# Patient Record
Sex: Male | Born: 1990 | Race: Black or African American | Hispanic: No | Marital: Single | Smoking: Current every day smoker
Health system: Southern US, Community
[De-identification: ages and names within clinical notes are randomized; demographics above are authoritative.]

## PROBLEM LIST (undated history)

## (undated) HISTORY — PX: ANKLE SURGERY: SHX546

---

## 2016-11-28 ENCOUNTER — Encounter: Payer: Self-pay | Admitting: Emergency Medicine

## 2016-11-28 ENCOUNTER — Emergency Department
Admission: EM | Admit: 2016-11-28 | Discharge: 2016-11-28 | Disposition: A | Discharge status: Home / Self Care | Payer: Self-pay | Attending: Emergency Medicine | Admitting: Emergency Medicine

## 2016-11-28 DIAGNOSIS — L02612 Cutaneous abscess of left foot: Secondary | ICD-10-CM | POA: Insufficient documentation

## 2016-11-28 DIAGNOSIS — F17210 Nicotine dependence, cigarettes, uncomplicated: Secondary | ICD-10-CM | POA: Insufficient documentation

## 2016-11-28 MED ORDER — HYDROCODONE-ACETAMINOPHEN 5-325 MG PO TABS: 1.0000 | ORAL_TABLET | ORAL | 0 refills | Status: AC | PRN

## 2016-11-28 MED ORDER — SULFAMETHOXAZOLE-TRIMETHOPRIM 800-160 MG PO TABS: 1.0000 | ORAL_TABLET | Freq: Two times a day (BID) | ORAL | 0 refills | Status: AC

## 2016-11-28 NOTE — ED Provider Notes (Signed)
[facility] Emergency Department Provider Note  ____________________________________________  Time seen: Approximately 12:32 PM  I have reviewed the triage vital signs and the nursing notes.   HISTORY  Chief Complaint Abscess   HPI Nathan Hoffman is a 26 y.o. male who presents to the emergency department for evaluation and treatment of an abscess to his left heel that has been present for approximately 3 days.  He states that yesterday the area opened and began draining a white purulent drainage.  He denies fever.  He states that the area has been increasingly painful.  He does not recall any specific injury.  He does have a history of an Achilles repair and the abscess is near the site of the old incision.  He denies having had skin infections in the past. History reviewed. No pertinent past medical history.  There are no active problems to display for this patient.   Past Surgical History:  Procedure Laterality Date  . ANKLE SURGERY      Prior to Admission medications   Medication Sig Start Date End Date Taking? Authorizing Provider  HYDROcodone-acetaminophen (NORCO/VICODIN) 5-325 MG tablet Take 1 tablet by mouth every 4 (four) hours as needed for moderate pain. 11/28/16 11/28/17  Aalaya Yadao, Rulon Eisenmengerari B, FNP  sulfamethoxazole-trimethoprim (BACTRIM DS,SEPTRA DS) 800-160 MG tablet Take 1 tablet by mouth 2 (two) times daily. 11/28/16   Chinita Pesterriplett, Porchia Sinkler B, FNP    Allergies Patient has no known allergies.  No family history on file.  Social History Social History   Tobacco Use  . Smoking status: Current Every Day Smoker    Packs/day: 0.50    Types: Cigarettes  . Smokeless tobacco: Never Used  Substance Use Topics  . Alcohol use: Yes  . Drug use: No    Review of Systems  Constitutional: Negative for fever. Respiratory: Negative for cough or shortness of breath.  Musculoskeletal: Negative for myalgias Skin: Positive for abscess Neurological: Negative  for numbness or paresthesias. ____________________________________________   PHYSICAL EXAM:  VITAL SIGNS: ED Triage Vitals  Enc Vitals Group     BP 11/28/16 1143 139/70     Pulse Rate 11/28/16 1143 69     Resp 11/28/16 1329 18     Temp 11/28/16 1143 97.6 F (36.4 C)     Temp Source 11/28/16 1143 Oral     SpO2 11/28/16 1143 100 %     Weight 11/28/16 1143 225 lb (102.1 kg)     Height 11/28/16 1143 6\' 1"  (1.854 m)     Head Circumference --      Peak Flow --      Pain Score 11/28/16 1143 5     Pain Loc --      Pain Edu? --      Excl. in GC? --      Constitutional: Well appearing. Eyes: Conjunctivae are clear without discharge or drainage. Nose: No rhinorrhea noted. Mouth/Throat: Airway is patent.  Neck: No stridor. Unrestricted range of motion observed.  Cardiovascular: Capillary refill is <3 seconds.  Respiratory: Respirations are even and unlabored.. Musculoskeletal: Unrestricted range of motion observed. Neurologic: Awake, alert, and oriented x 4.  Skin: 1 cm mildly fluctuant area over the left Achilles that is draining purulent fluid.  No area of induration or erythema.   ____________________________________________   LABS (all labs ordered are listed, but only abnormal results are displayed)  Labs Reviewed - No data to display ____________________________________________  EKG  Not indicated ____________________________________________  RADIOLOGY  Not indicated ____________________________________________  PROCEDURES  Procedure(s) performed: None ____________________________________________   INITIAL IMPRESSION / ASSESSMENT AND PLAN / ED COURSE  Nathan Hoffman is a 26 y.o. male who presents to the emergency department for evaluation and treatment of an abscess to the left foot.  The area is spontaneously draining.  He will be placed on a course of Bactrim and given a prescription for Norco.  He was encouraged to follow-up with podiatry for symptoms that  are not improving over the weekend.  He was advised to return to the emergency department for symptoms change or worsen if he is unable to schedule an appointment.  Pertinent labs & imaging results that were available during my care of the patient were reviewed by me and considered in my medical decision making (see chart for details). ____________________________________________   FINAL CLINICAL IMPRESSION(S) / ED DIAGNOSES  Final diagnoses:  Abscess of left foot    Note:  This document was prepared using Dragon voice recognition software and may include unintentional dictation errors.    Chinita Pesterriplett, Juniper Snyders B, FNP 11/28/16 1409    Nita SickleVeronese, Burnett, MD 11/28/16 [phone]

## 2016-11-28 NOTE — ED Notes (Signed)
Patient complaining of drainage from surgical repair of right Achilles tendon (repair done in 2008).

## 2016-11-28 NOTE — ED Triage Notes (Signed)
Pt comes into the ED via POV c/o abscess on the right ankle.  Patient states it started two days ago and now it is currently draining purulent drainage.  Denies any fevers at home.  Patient states the abscess is on an old surgical scar.  Patient ambulatory to triage at this time and in NAD.

## 2016-11-28 NOTE — ED Notes (Signed)
Ace wrap and guaze applied to right ankle. Patient verbalized understanding of discharge instructions and follow-up care. Ambulatory to lobby with NAD noted.

## 2017-02-06 ENCOUNTER — Emergency Department: Payer: Self-pay

## 2017-02-06 ENCOUNTER — Other Ambulatory Visit: Payer: Self-pay

## 2017-02-06 ENCOUNTER — Emergency Department
Admission: EM | Admit: 2017-02-06 | Discharge: 2017-02-06 | Disposition: A | Discharge status: Home / Self Care | Payer: Self-pay | Attending: Emergency Medicine | Admitting: Emergency Medicine

## 2017-02-06 ENCOUNTER — Encounter: Payer: Self-pay | Admitting: Emergency Medicine

## 2017-02-06 DIAGNOSIS — Z79899 Other long term (current) drug therapy: Secondary | ICD-10-CM | POA: Insufficient documentation

## 2017-02-06 DIAGNOSIS — M86671 Other chronic osteomyelitis, right ankle and foot: Secondary | ICD-10-CM | POA: Insufficient documentation

## 2017-02-06 DIAGNOSIS — F17210 Nicotine dependence, cigarettes, uncomplicated: Secondary | ICD-10-CM | POA: Insufficient documentation

## 2017-02-06 LAB — CBC WITH DIFFERENTIAL/PLATELET
Basophils Absolute: 0 10*3/uL (ref 0–0.1)
Basophils Relative: 1 %
Eosinophils Absolute: 0.1 10*3/uL (ref 0–0.7)
Eosinophils Relative: 2 %
HCT: 46.6 % (ref 40.0–52.0)
Hemoglobin: 15.9 g/dL (ref 13.0–18.0)
Lymphocytes Relative: 25 %
Lymphs Abs: 1.8 10*3/uL (ref 1.0–3.6)
MCH: 30.4 pg (ref 26.0–34.0)
MCHC: 34.1 g/dL (ref 32.0–36.0)
MCV: 89.2 fL (ref 80.0–100.0)
Monocytes Absolute: 0.6 10*3/uL (ref 0.2–1.0)
Monocytes Relative: 9 %
Neutro Abs: 4.6 10*3/uL (ref 1.4–6.5)
Neutrophils Relative %: 63 %
Platelets: 295 10*3/uL (ref 150–440)
RBC: 5.22 MIL/uL (ref 4.40–5.90)
RDW: 13.4 % (ref 11.5–14.5)
WBC: 7.3 10*3/uL (ref 3.8–10.6)

## 2017-02-06 LAB — COMPREHENSIVE METABOLIC PANEL
ALT: 28 U/L (ref 17–63)
AST: 22 U/L (ref 15–41)
Albumin: 4.6 g/dL (ref 3.5–5.0)
Alkaline Phosphatase: 59 U/L (ref 38–126)
Anion gap: 14 (ref 5–15)
BUN: 12 mg/dL (ref 6–20)
CO2: 21 mmol/L — ABNORMAL LOW (ref 22–32)
Calcium: 9.7 mg/dL (ref 8.9–10.3)
Chloride: 103 mmol/L (ref 101–111)
Creatinine, Ser: 1.16 mg/dL (ref 0.61–1.24)
GFR calc Af Amer: >60 mL/min (ref >60)
GFR calc non Af Amer: >60 mL/min (ref >60)
Glucose, Bld: 93 mg/dL (ref 65–99)
Potassium: 4 mmol/L (ref 3.5–5.1)
Sodium: 138 mmol/L (ref 135–145)
Total Bilirubin: 0.6 mg/dL (ref 0.3–1.2)
Total Protein: 8.4 g/dL — ABNORMAL HIGH (ref 6.5–8.1)

## 2017-02-06 MED ORDER — CLINDAMYCIN PHOSPHATE 600 MG/50ML IV SOLN
600.0000 mg | Freq: Once | INTRAVENOUS | Status: AC
  Administered 2017-02-06: 600 mg via INTRAVENOUS
  Filled 2017-02-06: qty 50

## 2017-02-06 MED ORDER — IOPAMIDOL (ISOVUE-370) INJECTION 76%
75.0000 mL | Freq: Once | INTRAVENOUS | Status: AC | PRN
  Administered 2017-02-06: 75 mL via INTRAVENOUS
  Filled 2017-02-06: qty 75

## 2017-02-06 MED ORDER — IOPAMIDOL (ISOVUE-300) INJECTION 61%
75.0000 mL | Freq: Once | INTRAVENOUS | Status: DC | PRN
  Filled 2017-02-06: qty 75

## 2017-02-06 NOTE — ED Notes (Signed)
Pt presents with abscess on right ankle. Pt states he was seen here 2 mths ago and given bacterium but has not helped. Pt is NAD awaiting EDP.

## 2017-02-06 NOTE — ED Triage Notes (Signed)
Pt to ED via POV c/o abscess on right ankle. Pt states that he was seen for same 2 months ago, was given antibiotics but it has not cleared up.

## 2017-02-06 NOTE — ED Provider Notes (Signed)
[facility] Emergency Department Provider Note  ____________________________________________  Time seen: Approximately 3:30 PM  I have reviewed the triage vital signs and the nursing notes.   HISTORY  Chief Complaint Abscess    HPI Nathan Hoffman is a 27 y.o. male with a history of right Achilles tendon repair 8 years ago in [place] presents to the emergency department with a chronic abscess of the right posterior ankle in the distribution of the Achilles tendon.  Patient reports that he was seen approximately 8 weeks ago and was discharged with Bactrim.  Patient reports that initially, abscess seemed to decrease in size but after completing Bactrim, abscess enlarged.  Patient reports that abscess spontaneously drains with ambulation.  He denies fever and chills.  Patient has tried self-expression numerous times.  He has noticed surrounding erythema and callused dermatitis.  Patient experiences pain with ambulation and relief of pain with rest.   History reviewed. No pertinent past medical history.  There are no active problems to display for this patient.   Past Surgical History:  Procedure Laterality Date  . ANKLE SURGERY      Prior to Admission medications   Medication Sig Start Date End Date Taking? Authorizing Provider  HYDROcodone-acetaminophen (NORCO/VICODIN) 5-325 MG tablet Take 1 tablet by mouth every 4 (four) hours as needed for moderate pain. 11/28/16 11/28/17  Triplett, Rulon Eisenmenger B, FNP  sulfamethoxazole-trimethoprim (BACTRIM DS,SEPTRA DS) 800-160 MG tablet Take 1 tablet by mouth 2 (two) times daily. 11/28/16   Chinita Pester, FNP    Allergies Patient has no known allergies.  No family history on file.  Social History Social History   Tobacco Use  . Smoking status: Current Every Day Smoker    Packs/day: 0.50    Types: Cigarettes  . Smokeless tobacco: Never Used  Substance Use Topics  . Alcohol use: Yes  . Drug use: No     Review of  Systems  Constitutional: No fever/chills Eyes: No visual changes. No discharge ENT: No upper respiratory complaints. Cardiovascular: no chest pain. Respiratory: no cough. No SOB. Musculoskeletal: Patient has right ankle pain.  Skin: Patient has cellulitis of right ankle.  Neurological: Negative for headaches, focal weakness or numbness.  ____________________________________________   PHYSICAL EXAM:  VITAL SIGNS: ED Triage Vitals [02/06/17 1420]  Enc Vitals Group     BP 139/69     Pulse Rate 64     Resp 18     Temp 98.4 F (36.9 C)     Temp Source Oral     SpO2 100 %     Weight      Height      Head Circumference      Peak Flow      Pain Score      Pain Loc      Pain Edu?      Excl. in GC?      Constitutional: Alert and oriented. Well appearing and in no acute distress. Eyes: Conjunctivae are normal. PERRL. EOMI. Cardiovascular: Normal rate, regular rhythm. Normal S1 and S2.  Good peripheral circulation. Respiratory: Normal respiratory effort without tachypnea or retractions. Lungs CTAB. Good air entry to the bases with no decreased or absent breath sounds. Musculoskeletal: Patient is able to move all 5 right toes.  No pain is elicited with palpation of the right calf.  Palpable dorsalis pedis pulse, right. Neurologic:  Normal speech and language. No gross focal neurologic deficits are appreciated.  Skin: Patient has induration and erythema along the course of  distal Achilles tendon with purulent exudate expressed with distal to proximal palpation of Achilles tendon. Psychiatric: Mood and affect are normal. Speech and behavior are normal. Patient exhibits appropriate insight and judgement.   ____________________________________________   LABS (all labs ordered are listed, but only abnormal results are displayed)  Labs Reviewed  COMPREHENSIVE METABOLIC PANEL - Abnormal; Notable for the following components:      Result Value   CO2 21 (*)    Total Protein 8.4 (*)     All other components within normal limits  CBC WITH DIFFERENTIAL/PLATELET   ____________________________________________  EKG   ____________________________________________  RADIOLOGY Geraldo PitterI, Zailey Audia M Maddi Collar, personally viewed and evaluated these images (plain radiographs) as part of my medical decision making, as well as reviewing the written report by the radiologist.   Dg Ankle Complete Right  Result Date: 02/06/2017 CLINICAL DATA:  Chronic right foot abscess EXAM: RIGHT ANKLE - COMPLETE 3+ VIEW COMPARISON:  None. FINDINGS: No fracture or dislocation is seen. The ankle mortise is intact. Postsurgical changes involving the posterior calcaneus. The visualized soft tissues are unremarkable. No radiopaque foreign body is seen. IMPRESSION: Negative. Electronically Signed   By: Charline BillsSriyesh  Krishnan M.D.   On: 02/06/2017 15:32   Ct Extremity Lower Right W Contrast  Result Date: 02/06/2017 CLINICAL DATA:  Cellulitis of the right lower leg. EXAM: CT OF THE LOWER RIGHT EXTREMITY WITH CONTRAST TECHNIQUE: Multidetector CT imaging of the lower right extremity was performed according to the standard protocol following intravenous contrast administration. COMPARISON:  Radiographs dated 02/06/2017 CONTRAST:  75mL ISOVUE-370 IOPAMIDOL (ISOVUE-370) INJECTION 76% FINDINGS: Bones/Joint/Cartilage There is chronic bone resorption around the metallic anchor in the posterior calcaneus at the site of previous Achilles tendon repair. This is consistent with chronic osteomyelitis. No other significant bone abnormality. There is a chronic area of irregular sclerosis in the anterolateral aspect of the proximal tibia consistent with a benign bone island. Muscles and Tendons There is marked hypertrophy of the distal Achilles tendon with small areas of lucency within the tendon best seen on series 7, probably representing small pus collections. There is slight soft tissue stranding around the hypertrophied tendon extending into  Kager's fat pad anterior to the distal Achilles tendon. Muscles of the lower leg appear normal. No joint effusions. No soft tissue abscesses extrinsic to the Achilles tendon. IMPRESSION: 1. Chronic osteomyelitis of the posterior calcaneus around the surgical anchor at the site of Achilles tendon repair. Bone is been resorbed around the anchor. 2. 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 Adjacent cellulitis. Electronically Signed   By: Francene BoyersJames  Maxwell M.D.   On: 02/06/2017 17:19    ____________________________________________    PROCEDURES  Procedure(s) performed:    Procedures    Medications  clindamycin (CLEOCIN) IVPB 600 mg (600 mg Intravenous New Bag/Given 02/06/17 1822)  iopamidol (ISOVUE-370) 76 % injection 75 mL (75 mLs Intravenous Contrast Given 02/06/17 1645)     ____________________________________________   INITIAL IMPRESSION / ASSESSMENT AND PLAN / ED COURSE  Pertinent labs & imaging results that were available during my care of the patient were reviewed by me and considered in my medical decision making (see chart for details).  Review of the [place] CSRS was performed in accordance of the [facility] prior to dispensing any controlled drugs.     Assessment and Plan:  Osteomyelitis Abscess Patient presents to the emergency department with right ankle pain Differential diagnosis included cellulitis, abscess and osteomyelitis.  Due to chronic  nature of cellulitis and possible abscess, CT of right lower extremity was obtained which was concerning for chronic osteomyelitis and an abscess within the hypertrophied distal Achilles tendon.  Dr. Odis Luster was consulted regarding patient's case.  Dr. Odis Luster advised that patient be made n.p.o. after midnight and that patient should call office tomorrow.   Dr. Odis Luster conveyed that patient will be taken to the OR tomorrow for debridement.  Patient was given IV clindamycin in  the emergency department. Patient instructions were given to patient during 3 different instances of this emergency department encounter and patient voiced understanding.  All patient questions were answered.    ____________________________________________  FINAL CLINICAL IMPRESSION(S) / ED DIAGNOSES  Final diagnoses:  Other chronic osteomyelitis of right ankle (HCC)      NEW MEDICATIONS STARTED DURING THIS VISIT:  ED Discharge Orders    None          This chart was dictated using voice recognition software/Dragon. Despite best efforts to proofread, errors can occur which can change the meaning. Any change was purely unintentional.    Orvil Feil, PA-C 02/06/17 1848    Myrna Blazer, MD 02/08/17 [phone]

## 2019-09-23 IMAGING — CT CT EXTREM LOW W/ CM*R*
2 of 3 series · 11 of 46 positions shown, 12 images · IV contrast (agent unspecified)
Comparison: Radiographs dated 02/06/2017

CONTRAST:  75mL ARS21H-UCB IOPAMIDOL (ARS21H-UCB) INJECTION 76%

CLINICAL DATA: Cellulitis of the right lower leg.

EXAM:
CT OF THE LOWER RIGHT EXTREMITY WITH CONTRAST
TECHNIQUE: Multidetector CT imaging of the lower right extremity was performed
according to the standard protocol following intravenous contrast
administration.

[Series 7: axial st · axial · 0.53mm/px · z∈[-250,+318]mm · 8 of 434 slices shown, 9 images]
[im 28/434  soft-tissue]
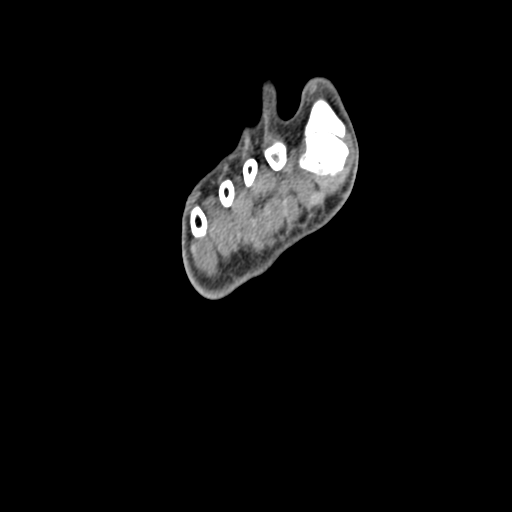
[im 28/434  bone]
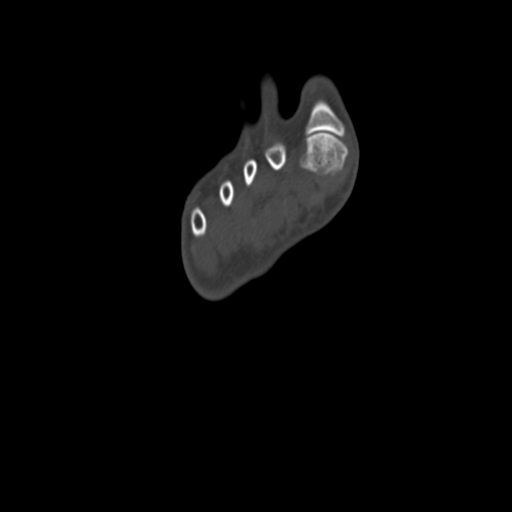
[im 84/434  soft-tissue]
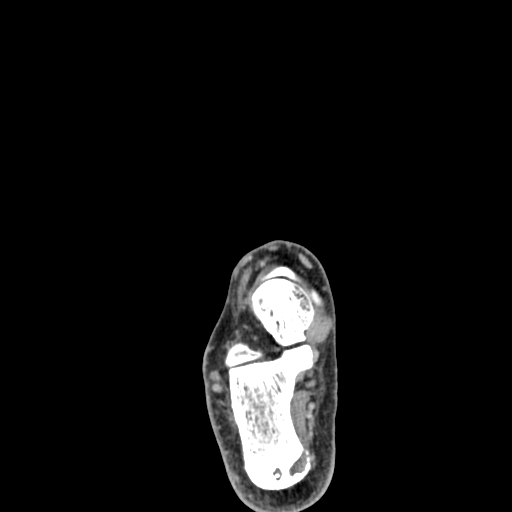
[im 140/434  soft-tissue]
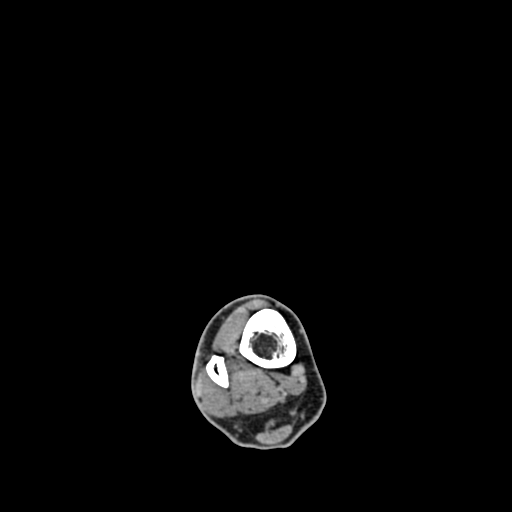
[im 196/434  soft-tissue]
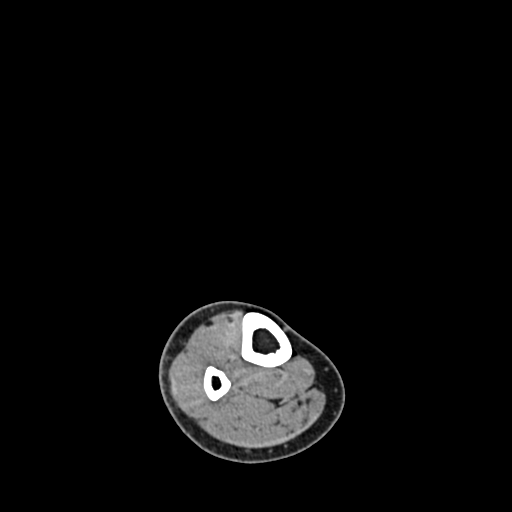
[im 238/434  soft-tissue]
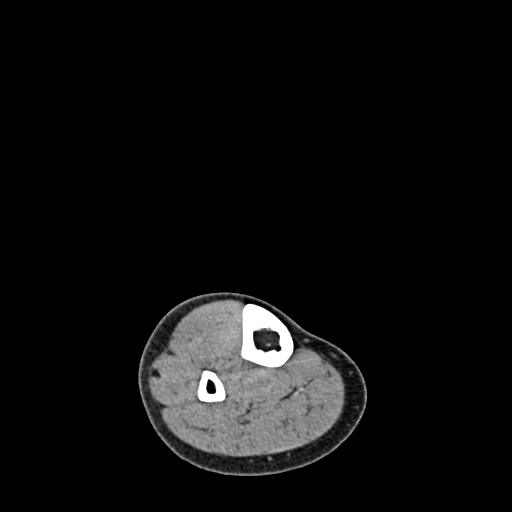
[im 294/434  soft-tissue]
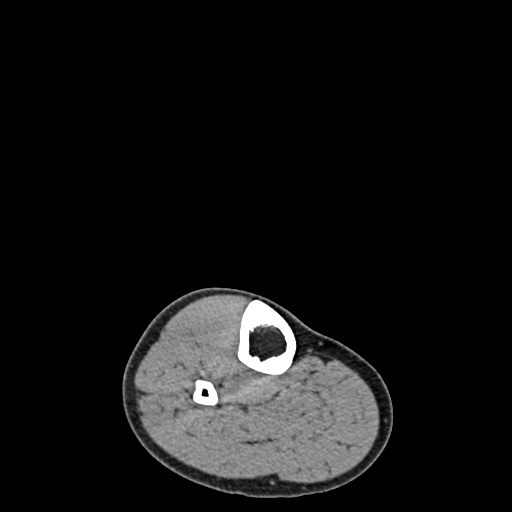
[im 350/434  soft-tissue]
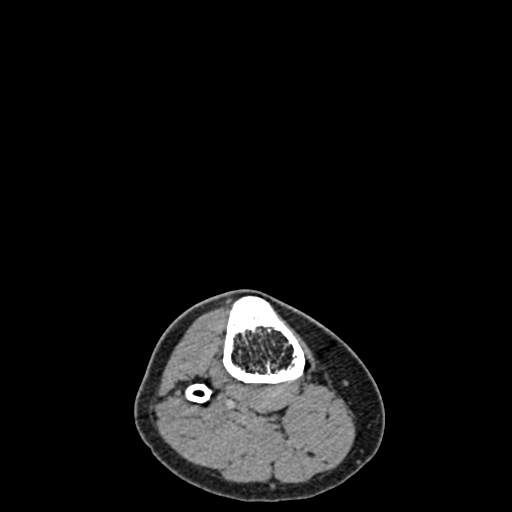
[im 406/434  soft-tissue]
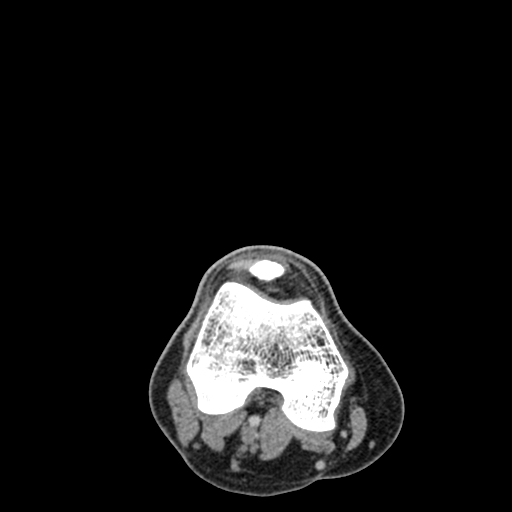

[Series 8: coronal st · coronal · 0.40mm/px · 3 of 180 slices shown]
[im 60/180  soft-tissue]
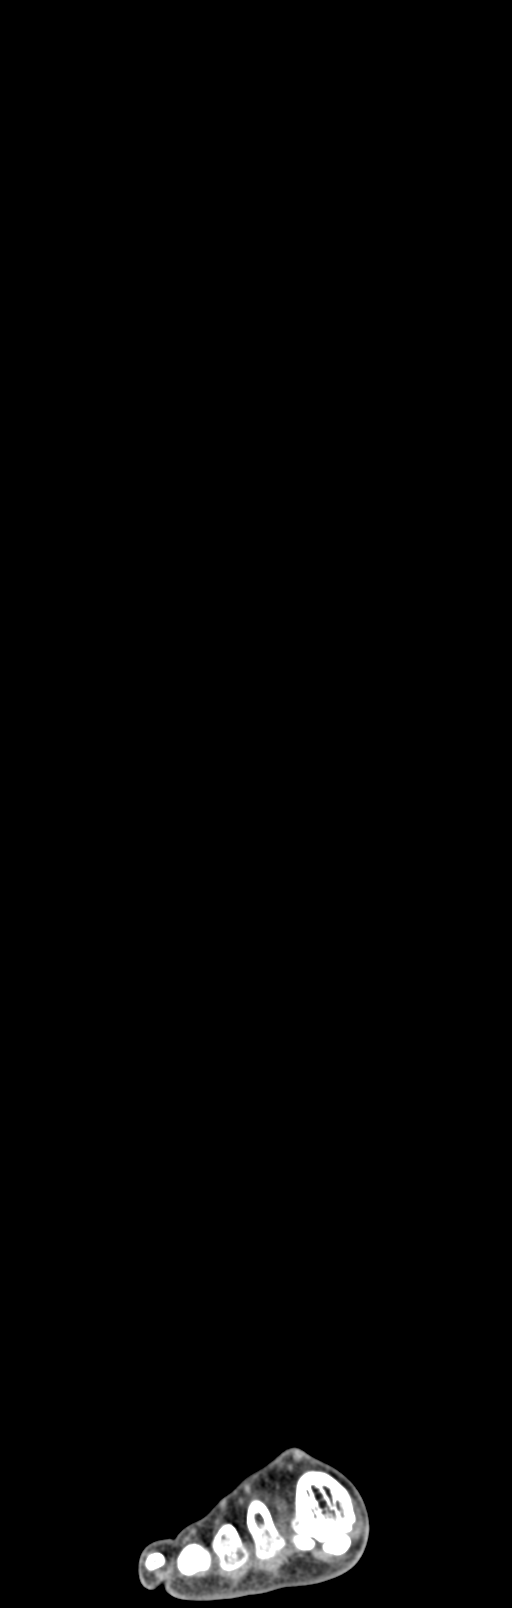
[im 80/180  soft-tissue]
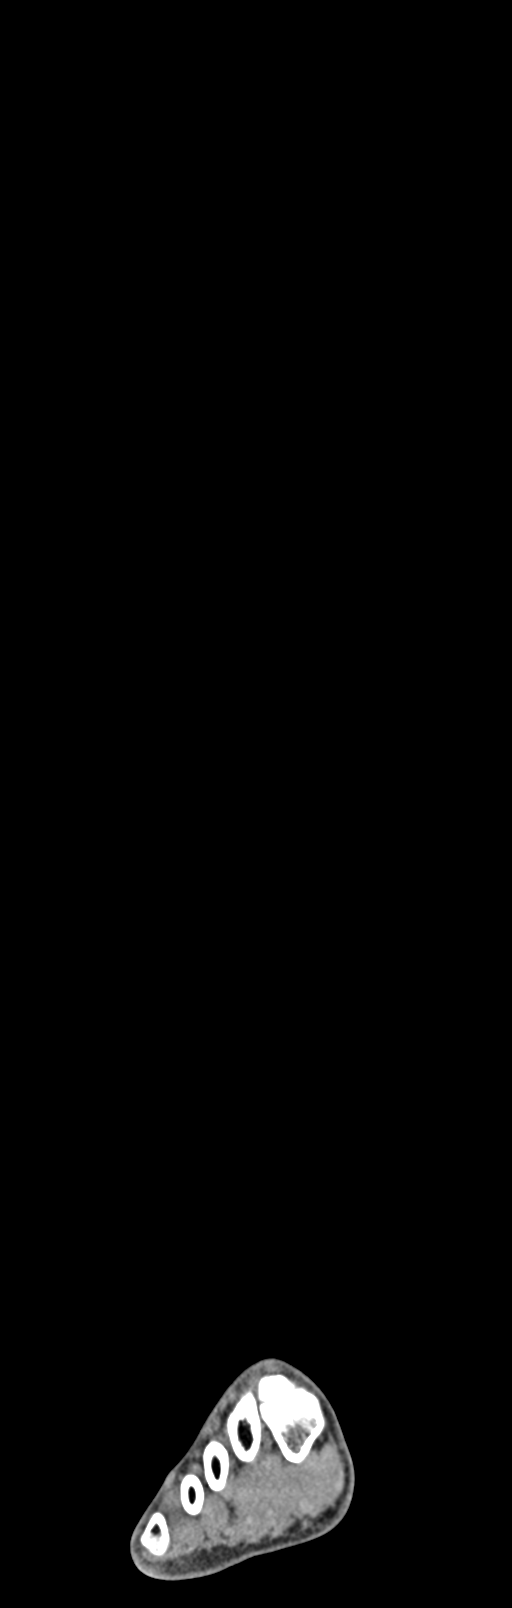
[im 100/180  soft-tissue]
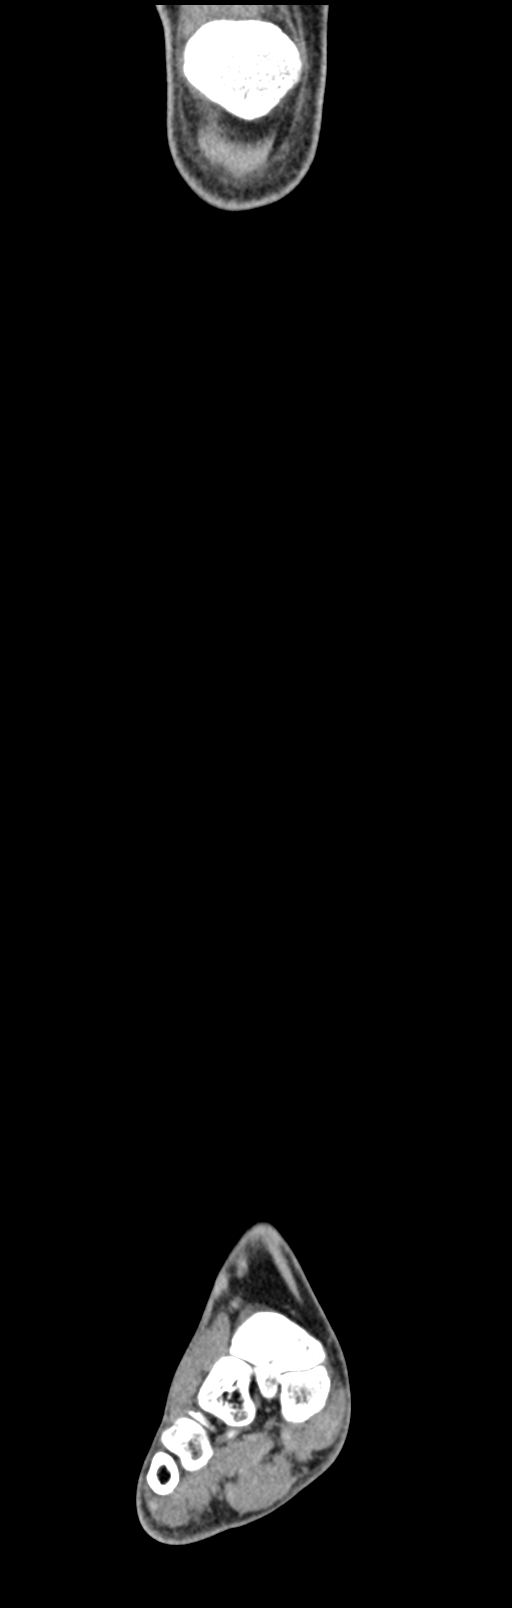

[11 of 46 positions shown; findings below may reference images not displayed]

FINDINGS: Bones/Joint/Cartilage

There is chronic bone resorption around the metallic anchor in the
posterior calcaneus at the site of previous Achilles tendon repair.
This is consistent with chronic osteomyelitis.

No other significant bone abnormality. There is a chronic area of
irregular sclerosis in the anterolateral aspect of the proximal
tibia consistent with a benign bone island.

Muscles and Tendons

There is marked hypertrophy of the distal Achilles tendon with small
areas of lucency within the tendon best seen on series 7, probably
representing small pus collections. There is slight soft tissue
stranding around the hypertrophied tendon extending into Kager's fat
pad anterior to the distal Achilles tendon.

Muscles of the lower leg appear normal. No joint effusions. No soft
tissue abscesses extrinsic to the Achilles tendon.
IMPRESSION: 1. Chronic osteomyelitis of the posterior calcaneus around the
surgical anchor at the site of Achilles tendon repair. Bone is been
resorbed around the anchor.
2. Chronic hypertrophy of the distal Achilles tendon with probable
tiny abscesses in the hypertrophied distal tendon. Adjacent
cellulitis.

## 2020-06-23 ENCOUNTER — Other Ambulatory Visit: Payer: Self-pay

## 2020-06-23 ENCOUNTER — Emergency Department
Admission: EM | Admit: 2020-06-23 | Discharge: 2020-06-23 | Disposition: A | Discharge status: Home / Self Care | Payer: Self-pay | Attending: Emergency Medicine | Admitting: Emergency Medicine

## 2020-06-23 DIAGNOSIS — E860 Dehydration: Secondary | ICD-10-CM | POA: Insufficient documentation

## 2020-06-23 DIAGNOSIS — T675XXA Heat exhaustion, unspecified, initial encounter: Secondary | ICD-10-CM | POA: Insufficient documentation

## 2020-06-23 DIAGNOSIS — F17210 Nicotine dependence, cigarettes, uncomplicated: Secondary | ICD-10-CM | POA: Insufficient documentation

## 2020-06-23 LAB — BASIC METABOLIC PANEL
Anion gap: 10 (ref 5–15)
BUN: 12 mg/dL (ref 6–20)
CO2: 24 mmol/L (ref 22–32)
Calcium: 9.9 mg/dL (ref 8.9–10.3)
Chloride: 106 mmol/L (ref 98–111)
Creatinine, Ser: 1.18 mg/dL (ref 0.61–1.24)
GFR, Estimated: >60 mL/min (ref >60)
Glucose, Bld: 113 mg/dL — ABNORMAL HIGH (ref 70–99)
Potassium: 3.7 mmol/L (ref 3.5–5.1)
Sodium: 140 mmol/L (ref 135–145)

## 2020-06-23 LAB — TROPONIN I (HIGH SENSITIVITY): Troponin I (High Sensitivity): 3 ng/L (ref <18)

## 2020-06-23 LAB — CBC
HCT: 44.7 % (ref 39.0–52.0)
Hemoglobin: 15.5 g/dL (ref 13.0–17.0)
MCH: 30.5 pg (ref 26.0–34.0)
MCHC: 34.7 g/dL (ref 30.0–36.0)
MCV: 87.8 fL (ref 80.0–100.0)
Platelets: 335 10*3/uL (ref 150–400)
RBC: 5.09 MIL/uL (ref 4.22–5.81)
RDW: 12.4 % (ref 11.5–15.5)
WBC: 7.8 10*3/uL (ref 4.0–10.5)
nRBC: 0 % (ref 0.0–0.2)

## 2020-06-23 MED ORDER — ONDANSETRON HCL 4 MG/2ML IJ SOLN
4.0000 mg | Freq: Once | INTRAMUSCULAR | Status: AC
  Administered 2020-06-23: 10:00:00 4 mg via INTRAVENOUS
  Filled 2020-06-23: qty 2

## 2020-06-23 MED ORDER — LACTATED RINGERS IV BOLUS
1000.0000 mL | Freq: Once | INTRAVENOUS | Status: AC
  Administered 2020-06-23: 10:00:00 1000 mL via INTRAVENOUS

## 2020-06-23 NOTE — ED Triage Notes (Signed)
Pt to ED POV for dizziness and nausea. States he felt like he was going to pass out last night.  Denies cp.  +shob that started yesterday. Pt in NAD, RR even and unlabored, speaking in complete sentences Ambulatory  Pt states he was working in heat yesterday

## 2020-06-23 NOTE — ED Notes (Signed)
Lab called and informed of troponin add-on. Lab verbalized understanding.  

## 2020-06-23 NOTE — ED Provider Notes (Signed)
[facility] Emergency Department Provider Note  ____________________________________________   Event Date/Time   First MD Initiated Contact with Patient 06/23/20 [id number]     (approximate)  I have reviewed the triage vital signs and the nursing notes.   HISTORY  Chief Complaint Dizziness    HPI Nathan Hoffman is a 30 y.o. male here with generalized weakness and lightheadedness.  The patient states that for the last 4 days, he started a new job working with [employer].  He was working in the heat, Clinical research associate.  He states he was drinking water throughout the day but felt like he was still getting dehydrated and did not urinate much.  He states that over the last 24 hours, he has felt very weak, lightheaded, and especially dizzy upon standing.  He got up to take his partner to work today, and while sitting in the car, began to feel lightheaded and dizzy.  He then became nauseous and vomited.  He states he feels fatigued.  Denies any overt chest pain.  No abdominal pain.  No recent changes in health.  No medication changes.  No history of cardiac or pulmonary disease.  He states that this job is new and is significantly more physically active than he was prior to obtaining the job.  He is working in high heat without air conditioning.    History reviewed. No pertinent past medical history.  There are no problems to display for this patient.   Past Surgical History:  Procedure Laterality Date   ANKLE SURGERY      Prior to Admission medications   Medication Sig Start Date End Date Taking? Authorizing Provider  sulfamethoxazole-trimethoprim (BACTRIM DS,SEPTRA DS) 800-160 MG tablet Take 1 tablet by mouth 2 (two) times daily. 11/28/16   Chinita Pester, FNP    Allergies Patient has no known allergies.  No family history on file.  Social History Social History   Tobacco Use   Smoking status: Every Day    Packs/day: 0.50    Pack years: 0.00    Types:  Cigarettes   Smokeless tobacco: Never  Substance Use Topics   Alcohol use: Yes   Drug use: No    Review of Systems  Review of Systems  Constitutional:  Positive for fatigue. Negative for chills and fever.  HENT:  Negative for sore throat.   Respiratory:  Negative for shortness of breath.   Cardiovascular:  Negative for chest pain.  Gastrointestinal:  Negative for abdominal pain.  Genitourinary:  Negative for flank pain.  Musculoskeletal:  Negative for neck pain.  Skin:  Negative for rash and wound.  Allergic/Immunologic: Negative for immunocompromised state.  Neurological:  Positive for syncope, weakness and light-headedness. Negative for numbness.  Hematological:  Does not bruise/bleed easily.  All other systems reviewed and are negative.   ____________________________________________  PHYSICAL EXAM:      VITAL SIGNS: ED Triage Vitals  Enc Vitals Group     BP 06/23/20 0723 116/87     Pulse Rate 06/23/20 0723 75     Resp 06/23/20 0723 18     Temp 06/23/20 0723 97.8 F (36.6 C)     Temp Source 06/23/20 0723 Oral     SpO2 06/23/20 0723 100 %     Weight 06/23/20 0724 243 lb (110.2 kg)     Height 06/23/20 0724 6\' 4"  (1.93 m)     Head Circumference --      Peak Flow --      Pain Score  06/23/20 0724 0     Pain Loc --      Pain Edu? --      Excl. in GC? --      Physical Exam Vitals and nursing note reviewed.  Constitutional:      General: He is not in acute distress.    Appearance: He is well-developed.  HENT:     Head: Normocephalic and atraumatic.     Mouth/Throat:     Mouth: Mucous membranes are dry.  Eyes:     Conjunctiva/sclera: Conjunctivae normal.  Cardiovascular:     Rate and Rhythm: Normal rate and regular rhythm.     Heart sounds: Normal heart sounds. No murmur heard.   No friction rub.  Pulmonary:     Effort: Pulmonary effort is normal. No respiratory distress.     Breath sounds: Normal breath sounds. No wheezing or rales.  Abdominal:     General:  There is no distension.     Palpations: Abdomen is soft.     Tenderness: There is no abdominal tenderness.  Musculoskeletal:     Cervical back: Neck supple.  Skin:    General: Skin is warm.     Capillary Refill: Capillary refill takes less than 2 seconds.  Neurological:     Mental Status: He is alert and oriented to person, place, and time.     Motor: No abnormal muscle tone.      ____________________________________________   LABS (all labs ordered are listed, but only abnormal results are displayed)  Labs Reviewed  BASIC METABOLIC PANEL - Abnormal; Notable for the following components:      Result Value   Glucose, Bld 113 (*)    All other components within normal limits  CBC  URINALYSIS, COMPLETE (UACMP) WITH MICROSCOPIC  TROPONIN I (HIGH SENSITIVITY)    ____________________________________________  EKG: Normal sinus rhythm, ventricular rate 73.  PR 170, QRS 90, QTc 4 1.  No acute ST elevations or depressions.  No acute events of acute ischemia or infarct. ________________________________________  RADIOLOGY All imaging, including plain films, CT scans, and ultrasounds, independently reviewed by me, and interpretations confirmed via formal radiology reads.  ED MD interpretation:   None  Official radiology report(s): No results found.  ____________________________________________  PROCEDURES   Procedure(s) performed (including Critical Care):  Procedures  ____________________________________________  INITIAL IMPRESSION / MDM / ASSESSMENT AND PLAN / ED COURSE  As part of my medical decision making, I reviewed the following data within the electronic MEDICAL RECORD NUMBER Nursing notes reviewed and incorporated, Old chart reviewed, Notes from prior ED visits, and [place] Controlled Substance Database       *Nathan Hoffman was evaluated in Emergency Department on 06/23/2020 for the symptoms described in the history of present illness. He was evaluated in the context of  the global COVID-19 pandemic, which necessitated consideration that the patient might be at risk for infection with the SARS-CoV-2 virus that causes COVID-19. Institutional protocols and algorithms that pertain to the evaluation of patients at risk for COVID-19 are in a state of rapid change based on information released by regulatory bodies including the CDC and federal and state organizations. These policies and algorithms were followed during the patient's care in the ED.  Some ED evaluations and interventions may be delayed as a result of limited staffing during the pandemic.*     Medical Decision Making: Very pleasant 30 year old male here with generalized weakness and lightheadedness.  I suspect this is related to heat exhaustion and exposure.  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  Symptoms are worse with standing.  Patient given fluids and feels markedly improved in the ED here.  His lab work is overall very reassuring.  BMP unremarkable with normal renal function.  CBC without anemia or other abnormality.  Troponin negative and EKG is nonischemic.  No arrhythmia noted.  Will encourage the patient to hydrate at home, and encouraged him to discuss with his employment, as he should be allowed adequate time to rest and cool off when working in extreme heat.  Work note was provided.  ____________________________________________  FINAL CLINICAL IMPRESSION(S) / ED DIAGNOSES  Final diagnoses:  Heat exhaustion, initial encounter  Dehydration     MEDICATIONS GIVEN DURING THIS VISIT:  Medications  lactated ringers bolus 1,000 mL (0 mLs Intravenous Stopped 06/23/20 1049)  ondansetron (ZOFRAN) injection 4 mg (4 mg Intravenous Given 06/23/20 0944)     ED Discharge Orders     None        Note:  This document was prepared using Dragon voice recognition software and may include unintentional dictation errors.   Shaune Pollack,  MD 06/23/20 1214

## 2020-06-23 NOTE — ED Notes (Signed)
ED Provider at bedside. 

## 2021-12-08 DIAGNOSIS — Z419 Encounter for procedure for purposes other than remedying health state, unspecified: Secondary | ICD-10-CM | POA: Diagnosis not present

## 2022-01-08 DIAGNOSIS — Z419 Encounter for procedure for purposes other than remedying health state, unspecified: Secondary | ICD-10-CM | POA: Diagnosis not present

## 2022-01-23 ENCOUNTER — Other Ambulatory Visit (LOCAL_COMMUNITY_HEALTH_CENTER): Payer: Self-pay

## 2022-01-23 DIAGNOSIS — Z111 Encounter for screening for respiratory tuberculosis: Secondary | ICD-10-CM

## 2022-01-23 NOTE — Patient Instructions (Signed)
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  ROI signed and pt will also check My Chart for results.  Walked pt to lab for blood draw.    Tonny Branch, RN

## 2022-01-26 ENCOUNTER — Telehealth: Payer: Self-pay

## 2022-01-26 LAB — QUANTIFERON-TB GOLD PLUS
QuantiFERON Mitogen Value: >10 IU/mL
QuantiFERON Nil Value: 0 IU/mL
QuantiFERON TB1 Ag Value: 0 IU/mL
QuantiFERON TB2 Ag Value: 0 IU/mL
QuantiFERON-TB Gold Plus: NEGATIVE

## 2022-01-26 NOTE — Telephone Encounter (Signed)
Call to patient with -QFT results. Servando Salina, RN

## 2022-02-08 DIAGNOSIS — Z419 Encounter for procedure for purposes other than remedying health state, unspecified: Secondary | ICD-10-CM | POA: Diagnosis not present

## 2022-02-14 ENCOUNTER — Telehealth: Payer: Self-pay

## 2022-02-14 NOTE — Telephone Encounter (Signed)
Mychart msg sent. AS, CMA 

## 2022-03-09 DIAGNOSIS — Z419 Encounter for procedure for purposes other than remedying health state, unspecified: Secondary | ICD-10-CM | POA: Diagnosis not present

## 2022-03-12 DIAGNOSIS — E669 Obesity, unspecified: Secondary | ICD-10-CM | POA: Diagnosis not present

## 2022-03-12 DIAGNOSIS — K219 Gastro-esophageal reflux disease without esophagitis: Secondary | ICD-10-CM | POA: Diagnosis not present

## 2022-03-12 DIAGNOSIS — R5383 Other fatigue: Secondary | ICD-10-CM | POA: Diagnosis not present

## 2022-03-12 DIAGNOSIS — E785 Hyperlipidemia, unspecified: Secondary | ICD-10-CM | POA: Diagnosis not present

## 2022-03-12 DIAGNOSIS — R609 Edema, unspecified: Secondary | ICD-10-CM | POA: Diagnosis not present

## 2022-04-09 DIAGNOSIS — Z419 Encounter for procedure for purposes other than remedying health state, unspecified: Secondary | ICD-10-CM | POA: Diagnosis not present

## 2022-05-09 DIAGNOSIS — Z419 Encounter for procedure for purposes other than remedying health state, unspecified: Secondary | ICD-10-CM | POA: Diagnosis not present

## 2022-06-09 DIAGNOSIS — Z419 Encounter for procedure for purposes other than remedying health state, unspecified: Secondary | ICD-10-CM | POA: Diagnosis not present

## 2022-06-26 DIAGNOSIS — E669 Obesity, unspecified: Secondary | ICD-10-CM | POA: Diagnosis not present

## 2022-06-26 DIAGNOSIS — R5383 Other fatigue: Secondary | ICD-10-CM | POA: Diagnosis not present

## 2022-06-26 DIAGNOSIS — K219 Gastro-esophageal reflux disease without esophagitis: Secondary | ICD-10-CM | POA: Diagnosis not present

## 2022-06-26 DIAGNOSIS — E559 Vitamin D deficiency, unspecified: Secondary | ICD-10-CM | POA: Diagnosis not present

## 2022-06-26 DIAGNOSIS — E785 Hyperlipidemia, unspecified: Secondary | ICD-10-CM | POA: Diagnosis not present

## 2022-07-09 DIAGNOSIS — Z419 Encounter for procedure for purposes other than remedying health state, unspecified: Secondary | ICD-10-CM | POA: Diagnosis not present

## 2022-07-30 ENCOUNTER — Ambulatory Visit: Payer: Medicaid Other | Admitting: Internal Medicine

## 2022-08-09 DIAGNOSIS — Z419 Encounter for procedure for purposes other than remedying health state, unspecified: Secondary | ICD-10-CM | POA: Diagnosis not present

## 2022-09-09 DIAGNOSIS — Z419 Encounter for procedure for purposes other than remedying health state, unspecified: Secondary | ICD-10-CM | POA: Diagnosis not present

## 2022-09-25 DIAGNOSIS — K219 Gastro-esophageal reflux disease without esophagitis: Secondary | ICD-10-CM | POA: Diagnosis not present

## 2022-09-25 DIAGNOSIS — E669 Obesity, unspecified: Secondary | ICD-10-CM | POA: Diagnosis not present

## 2022-09-25 DIAGNOSIS — R739 Hyperglycemia, unspecified: Secondary | ICD-10-CM | POA: Diagnosis not present

## 2022-09-25 DIAGNOSIS — E785 Hyperlipidemia, unspecified: Secondary | ICD-10-CM | POA: Diagnosis not present

## 2022-10-09 DIAGNOSIS — Z419 Encounter for procedure for purposes other than remedying health state, unspecified: Secondary | ICD-10-CM | POA: Diagnosis not present

## 2022-11-09 DIAGNOSIS — Z419 Encounter for procedure for purposes other than remedying health state, unspecified: Secondary | ICD-10-CM | POA: Diagnosis not present

## 2022-11-19 DIAGNOSIS — E785 Hyperlipidemia, unspecified: Secondary | ICD-10-CM | POA: Diagnosis not present

## 2022-11-19 DIAGNOSIS — R739 Hyperglycemia, unspecified: Secondary | ICD-10-CM | POA: Diagnosis not present

## 2022-11-19 DIAGNOSIS — E669 Obesity, unspecified: Secondary | ICD-10-CM | POA: Diagnosis not present

## 2022-11-19 DIAGNOSIS — K219 Gastro-esophageal reflux disease without esophagitis: Secondary | ICD-10-CM | POA: Diagnosis not present

## 2022-12-05 DIAGNOSIS — E119 Type 2 diabetes mellitus without complications: Secondary | ICD-10-CM | POA: Diagnosis not present

## 2022-12-05 DIAGNOSIS — Z0000 Encounter for general adult medical examination without abnormal findings: Secondary | ICD-10-CM | POA: Diagnosis not present

## 2022-12-05 DIAGNOSIS — E669 Obesity, unspecified: Secondary | ICD-10-CM | POA: Diagnosis not present

## 2022-12-05 DIAGNOSIS — R609 Edema, unspecified: Secondary | ICD-10-CM | POA: Diagnosis not present

## 2022-12-05 DIAGNOSIS — E785 Hyperlipidemia, unspecified: Secondary | ICD-10-CM | POA: Diagnosis not present

## 2022-12-05 DIAGNOSIS — E559 Vitamin D deficiency, unspecified: Secondary | ICD-10-CM | POA: Diagnosis not present

## 2022-12-05 DIAGNOSIS — K219 Gastro-esophageal reflux disease without esophagitis: Secondary | ICD-10-CM | POA: Diagnosis not present

## 2022-12-05 DIAGNOSIS — R5383 Other fatigue: Secondary | ICD-10-CM | POA: Diagnosis not present

## 2022-12-09 DIAGNOSIS — Z419 Encounter for procedure for purposes other than remedying health state, unspecified: Secondary | ICD-10-CM | POA: Diagnosis not present

## 2023-01-09 DIAGNOSIS — Z419 Encounter for procedure for purposes other than remedying health state, unspecified: Secondary | ICD-10-CM | POA: Diagnosis not present

## 2023-02-09 DIAGNOSIS — Z419 Encounter for procedure for purposes other than remedying health state, unspecified: Secondary | ICD-10-CM | POA: Diagnosis not present

## 2023-03-09 DIAGNOSIS — Z419 Encounter for procedure for purposes other than remedying health state, unspecified: Secondary | ICD-10-CM | POA: Diagnosis not present

## 2023-04-20 DIAGNOSIS — Z419 Encounter for procedure for purposes other than remedying health state, unspecified: Secondary | ICD-10-CM | POA: Diagnosis not present

## 2023-05-20 DIAGNOSIS — Z419 Encounter for procedure for purposes other than remedying health state, unspecified: Secondary | ICD-10-CM | POA: Diagnosis not present

## 2023-06-20 DIAGNOSIS — Z419 Encounter for procedure for purposes other than remedying health state, unspecified: Secondary | ICD-10-CM | POA: Diagnosis not present

## 2023-07-20 DIAGNOSIS — Z419 Encounter for procedure for purposes other than remedying health state, unspecified: Secondary | ICD-10-CM | POA: Diagnosis not present

## 2023-08-20 DIAGNOSIS — Z419 Encounter for procedure for purposes other than remedying health state, unspecified: Secondary | ICD-10-CM | POA: Diagnosis not present
